# Patient Record
Sex: Female | Born: 1970 | Race: White | Hispanic: No | Marital: Married | Smoking: Never smoker
Health system: Southern US, Community
[De-identification: ages and names within clinical notes are randomized; demographics above are authoritative.]

## PROBLEM LIST (undated history)

## (undated) DIAGNOSIS — B009 Herpesviral infection, unspecified: Secondary | ICD-10-CM

## (undated) DIAGNOSIS — M1990 Unspecified osteoarthritis, unspecified site: Secondary | ICD-10-CM

## (undated) DIAGNOSIS — M069 Rheumatoid arthritis, unspecified: Secondary | ICD-10-CM

## (undated) HISTORY — DX: Rheumatoid arthritis, unspecified: M06.9

## (undated) HISTORY — DX: Herpesviral infection, unspecified: B00.9

---

## 2010-04-30 HISTORY — PX: ABDOMINOPLASTY: SUR9

## 2015-01-27 ENCOUNTER — Ambulatory Visit: Payer: Self-pay | Admitting: Family Medicine

## 2017-03-04 ENCOUNTER — Encounter: Payer: Self-pay | Admitting: Radiology

## 2017-10-29 ENCOUNTER — Encounter: Payer: Self-pay | Admitting: Radiology

## 2018-05-05 ENCOUNTER — Ambulatory Visit: Payer: Self-pay | Admitting: Family Medicine

## 2018-05-08 ENCOUNTER — Ambulatory Visit: Payer: Self-pay | Admitting: Family Medicine

## 2018-05-26 ENCOUNTER — Ambulatory Visit: Payer: Self-pay | Admitting: Family Medicine

## 2019-11-09 ENCOUNTER — Encounter: Payer: Self-pay | Admitting: Radiology

## 2020-08-08 ENCOUNTER — Other Ambulatory Visit: Payer: Self-pay | Admitting: Obstetrics and Gynecology

## 2020-08-08 DIAGNOSIS — R928 Other abnormal and inconclusive findings on diagnostic imaging of breast: Secondary | ICD-10-CM

## 2020-08-10 ENCOUNTER — Other Ambulatory Visit: Payer: Self-pay

## 2020-08-10 ENCOUNTER — Ambulatory Visit
Admission: RE | Admit: 2020-08-10 | Discharge: 2020-08-10 | Disposition: A | Discharge status: Home / Self Care | Payer: No Typology Code available for payment source | Source: Ambulatory Visit | Attending: Obstetrics and Gynecology | Admitting: Obstetrics and Gynecology

## 2020-08-10 ENCOUNTER — Ambulatory Visit: Payer: Self-pay

## 2020-08-10 DIAGNOSIS — R928 Other abnormal and inconclusive findings on diagnostic imaging of breast: Secondary | ICD-10-CM

## 2021-01-24 ENCOUNTER — Other Ambulatory Visit: Payer: Self-pay

## 2021-01-24 ENCOUNTER — Emergency Department (HOSPITAL_BASED_OUTPATIENT_CLINIC_OR_DEPARTMENT_OTHER)
Admission: EM | Admit: 2021-01-24 | Discharge: 2021-01-24 | Disposition: A | Discharge status: Home / Self Care | Payer: 59 | Attending: Emergency Medicine | Admitting: Emergency Medicine

## 2021-01-24 ENCOUNTER — Encounter (HOSPITAL_BASED_OUTPATIENT_CLINIC_OR_DEPARTMENT_OTHER): Payer: Self-pay | Admitting: *Deleted

## 2021-01-24 ENCOUNTER — Emergency Department (HOSPITAL_BASED_OUTPATIENT_CLINIC_OR_DEPARTMENT_OTHER): Payer: 59 | Admitting: Radiology

## 2021-01-24 DIAGNOSIS — J90 Pleural effusion, not elsewhere classified: Secondary | ICD-10-CM

## 2021-01-24 DIAGNOSIS — Z20822 Contact with and (suspected) exposure to covid-19: Secondary | ICD-10-CM | POA: Diagnosis not present

## 2021-01-24 DIAGNOSIS — W501XXA Accidental kick by another person, initial encounter: Secondary | ICD-10-CM | POA: Insufficient documentation

## 2021-01-24 DIAGNOSIS — S299XXA Unspecified injury of thorax, initial encounter: Secondary | ICD-10-CM | POA: Diagnosis present

## 2021-01-24 DIAGNOSIS — S20211A Contusion of right front wall of thorax, initial encounter: Secondary | ICD-10-CM | POA: Diagnosis not present

## 2021-01-24 HISTORY — DX: Unspecified osteoarthritis, unspecified site: M19.90

## 2021-01-24 LAB — RESP PANEL BY RT-PCR (FLU A&B, COVID) ARPGX2
Influenza A by PCR: NEGATIVE
Influenza B by PCR: NEGATIVE
SARS Coronavirus 2 by RT PCR: NEGATIVE
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
## 2021-01-24 NOTE — Discharge Instructions (Addendum)
It was our pleasure to provide your ER care today - we hope that you feel better.  From the recent contusion to chest wall, it is likely you have a bruised rib or a non-displaced rib fracture - a small right pleural effusion (I.e. small amount of fluid/possibly blood), is noted on your xray.   Take acetaminophen and/or ibuprofen as need for pain. Stay active, take full and deep breaths, light exercise is ok/good.   Follow up with primary care doctor in 1-2 weeks if symptoms fail to improve/resolve.  Return to ER if  worse, new symptoms, fevers, worsening cough, increased trouble breathing, or other concern.

## 2021-01-24 NOTE — ED Notes (Signed)
Patient verbalizes understanding of discharge instructions. Opportunity for questioning and answers were provided. Patient discharged from ED.  °

## 2021-01-26 ENCOUNTER — Ambulatory Visit: Payer: Self-pay
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
## 2021-02-20 ENCOUNTER — Other Ambulatory Visit: Payer: Self-pay

## 2021-02-20 ENCOUNTER — Encounter: Payer: Self-pay | Admitting: Physician Assistant

## 2021-02-20 ENCOUNTER — Ambulatory Visit (INDEPENDENT_AMBULATORY_CARE_PROVIDER_SITE_OTHER): Payer: 59 | Admitting: Physician Assistant

## 2021-02-20 VITALS — BP 101/68 | HR 69 | Temp 98.0°F | Ht 64.0 in | Wt 154.2 lb

## 2021-02-20 DIAGNOSIS — Z1211 Encounter for screening for malignant neoplasm of colon: Secondary | ICD-10-CM

## 2021-02-20 DIAGNOSIS — Z131 Encounter for screening for diabetes mellitus: Secondary | ICD-10-CM | POA: Diagnosis not present

## 2021-02-20 DIAGNOSIS — M129 Arthropathy, unspecified: Secondary | ICD-10-CM | POA: Diagnosis not present

## 2021-02-20 DIAGNOSIS — M1712 Unilateral primary osteoarthritis, left knee: Secondary | ICD-10-CM

## 2021-02-20 LAB — CBC WITH DIFFERENTIAL/PLATELET
Basophils Absolute: 0 10*3/uL (ref 0.0–0.1)
Basophils Relative: 0.4 % (ref 0.0–3.0)
Eosinophils Absolute: 0.3 10*3/uL (ref 0.0–0.7)
Eosinophils Relative: 3.3 % (ref 0.0–5.0)
HCT: 41.2 % (ref 36.0–46.0)
Hemoglobin: 13.4 g/dL (ref 12.0–15.0)
Lymphocytes Relative: 17.7 % (ref 12.0–46.0)
Lymphs Abs: 1.8 10*3/uL (ref 0.7–4.0)
MCHC: 32.5 g/dL (ref 30.0–36.0)
MCV: 89.2 fl (ref 78.0–100.0)
Monocytes Absolute: 0.8 10*3/uL (ref 0.1–1.0)
Monocytes Relative: 7.8 % (ref 3.0–12.0)
Neutro Abs: 7.4 10*3/uL (ref 1.4–7.7)
Neutrophils Relative %: 70.8 % (ref 43.0–77.0)
Platelets: 323 10*3/uL (ref 150.0–400.0)
RBC: 4.62 Mil/uL (ref 3.87–5.11)
RDW: 13.4 % (ref 11.5–15.5)
WBC: 10.4 10*3/uL (ref 4.0–10.5)

## 2021-02-20 LAB — COMPREHENSIVE METABOLIC PANEL
ALT: 13 U/L (ref 0–35)
AST: 17 U/L (ref 0–37)
Albumin: 4.3 g/dL (ref 3.5–5.2)
Alkaline Phosphatase: 65 U/L (ref 39–117)
BUN: 8 mg/dL (ref 6–23)
CO2: 26 mEq/L (ref 19–32)
Calcium: 9.2 mg/dL (ref 8.4–10.5)
Chloride: 102 mEq/L (ref 96–112)
Creatinine, Ser: 0.71 mg/dL (ref 0.40–1.20)
GFR: 100.24 mL/min (ref >60.00)
Glucose, Bld: 90 mg/dL (ref 70–99)
Potassium: 3.9 mEq/L (ref 3.5–5.1)
Sodium: 138 mEq/L (ref 135–145)
Total Bilirubin: 0.5 mg/dL (ref 0.2–1.2)
Total Protein: 7.1 g/dL (ref 6.0–8.3)

## 2021-02-20 LAB — URIC ACID: Uric Acid, Serum: 4.1 mg/dL (ref 2.4–7.0)

## 2021-02-20 LAB — SEDIMENTATION RATE: Sed Rate: 22 mm/hr — ABNORMAL HIGH (ref 0–20)

## 2021-02-20 NOTE — Progress Notes (Signed)
Subjective:    Patient ID: Sandra Cain, female    DOB: [birth year], 50 y.o.   MRN: [id number]  Chief Complaint  Patient presents with   Establish Care    HPI Patient is in today for new patient establishment. States a few weeks ago woke up with stiff, swollen joints diffusely. No fever, fatigue, headache, or other symptoms. She went to [facility], who drew a Lyme titer panel and all was negative except IgG P41 Ab was present. States that she was treated with doxycycline. Joint pain improved within three days of doxycycline. She has another two days left of treatment. She remembers having a tick bite in April this year, but says it was flat, not-engorged and probably on her skin less than 20 minutes. She never developed a rash after that. Mom has very bad osteoarthritis. Sister has hx of knee replacement.  Pt sees GYN for regular female exams. She has not had a screening colonoscopy done yet.   Past Medical History:  Diagnosis Date   Arthritis     Past Surgical History:  Procedure Laterality Date   CESAREAN SECTION     x 3    Family History  Problem Relation Age of Onset   Osteoarthritis Mother    Lung cancer Father    Osteoarthritis Sister    Alcoholism Brother    Drug abuse Brother     Social History   Tobacco Use   Smoking status: Never   Smokeless tobacco: Never  Vaping Use   Vaping Use: Never used  Substance Use Topics   Alcohol use: Never   Drug use: Never     Allergies  Allergen Reactions   Codeine Nausea And Vomiting   Nsaids Nausea And Vomiting   Flax Seeds [Flaxseed (Linseed)] Palpitations    Review of Systems REFER TO HPI FOR PERTINENT POSITIVES AND NEGATIVES      Objective:     BP 101/68   Pulse 69   Temp 98 F (36.7 C)   Ht 5' 4"  (1.626 m)   Wt 154 lb 3.2 oz (69.9 kg)   LMP 02/06/2021   SpO2 98%   BMI 26.47 kg/m   Wt Readings from Last 3 Encounters:  02/20/21 154 lb 3.2 oz (69.9 kg)  01/24/21 14 lb (6.35 kg)    BP  Readings from Last 3 Encounters:  02/20/21 101/68  01/24/21 111/70     Physical Exam Vitals and nursing note reviewed.  Constitutional:      Appearance: Normal appearance. She is normal weight. She is not toxic-appearing.  HENT:     Head: Normocephalic and atraumatic.     Right Ear: External ear normal.     Left Ear: External ear normal.     Nose: Nose normal.     Mouth/Throat:     Mouth: Mucous membranes are moist.  Eyes:     Extraocular Movements: Extraocular movements intact.     Conjunctiva/sclera: Conjunctivae normal.     Pupils: Pupils are equal, round, and reactive to light.  Cardiovascular:     Rate and Rhythm: Normal rate and regular rhythm.     Pulses: Normal pulses.     Heart sounds: Normal heart sounds.  Pulmonary:     Effort: Pulmonary effort is normal.     Breath sounds: Normal breath sounds.  Abdominal:     General: Abdomen is flat. Bowel sounds are normal.     Palpations: Abdomen is soft.  Musculoskeletal:  General: Normal range of motion.     Cervical back: Normal range of motion and neck supple.     Comments: No bogginess of joints. Good ROM in extremities. N/V intact.   Skin:    General: Skin is warm and dry.  Neurological:     General: No focal deficit present.     Mental Status: She is alert and oriented to person, place, and time.  Psychiatric:        Mood and Affect: Mood normal.        Behavior: Behavior normal.        Thought Content: Thought content normal.        Judgment: Judgment normal.       Assessment & Plan:   Problem List Items Addressed This Visit   None Visit Diagnoses     Arthritis, multiple joint involvement    -  Primary   Relevant Orders   CBC with Differential/Platelet   Comprehensive metabolic panel   Rheumatoid factor   Sedimentation rate   Uric acid   ANA   Primary osteoarthritis of left knee       Relevant Orders   CBC with Differential/Platelet   Comprehensive metabolic panel   Rheumatoid factor    Sedimentation rate   Uric acid   ANA   Screening for colon cancer       Relevant Orders   Ambulatory referral to Gastroenterology   Diabetes mellitus screening       Relevant Orders   Comprehensive metabolic panel       -Check labs today, treat pending results -Glad she is feeling better with the doxycycline. She has two days left to go. -She may also try anti-inflammatories such as Aleve or Advil prn pain and swelling. -Rheum referral if any positive markers for possible RA. -Referral for screening colonoscopy.   This note was prepared with assistance of Systems analyst. Occasional wrong-word or sound-a-like substitutions may have occurred due to the inherent limitations of voice recognition software.  Time Spent: 40 minutes of total time was spent on the date of the encounter performing the following actions: chart review prior to seeing the patient, obtaining history, performing a medically necessary exam, counseling on the treatment plan, placing orders, and documenting in our EHR.    Jeanet Lupe M Ina Scrivens, PA-C

## 2021-02-20 NOTE — Patient Instructions (Addendum)
Good to meet you today! Please go to the lab for blood work and I will send results through [facility]. Continue to stay active, drink plenty of water.

## 2021-02-21 ENCOUNTER — Telehealth: Payer: Self-pay

## 2021-02-21 LAB — RHEUMATOID FACTOR: Rheumatoid fact SerPl-aCnc: <14 IU/mL (ref <14)

## 2021-02-21 NOTE — Telephone Encounter (Signed)
Pt called regarding lab results. She would like a call back. Please Advise.

## 2021-02-21 NOTE — Telephone Encounter (Signed)
Patient has called back in regard to lab results.  I have advised patient in regard to lab notification process.  Patient is concerned about sedimentation rate.  Would like this to be addressed when followed up with.

## 2021-02-21 NOTE — Telephone Encounter (Signed)
Notified patient we will call once pcp review labs

## 2021-02-22 ENCOUNTER — Encounter: Payer: Self-pay | Admitting: Physician Assistant

## 2021-02-22 LAB — ANTI-NUCLEAR AB-TITER (ANA TITER): ANA Titer 1: 1:40 {titer} — ABNORMAL HIGH

## 2021-02-22 LAB — ANA: Anti Nuclear Antibody (ANA): POSITIVE — AB

## 2021-02-22 NOTE — Telephone Encounter (Signed)
See Result notes

## 2021-02-23 ENCOUNTER — Other Ambulatory Visit: Payer: Self-pay | Admitting: Physician Assistant

## 2021-02-23 ENCOUNTER — Telehealth: Payer: Self-pay

## 2021-02-23 MED ORDER — METHYLPREDNISOLONE 4 MG PO TBPK: ORAL_TABLET | ORAL | 0 refills | Status: DC

## 2021-02-23 NOTE — Telephone Encounter (Signed)
Spoke with patient she stated she wants to talk to [facility] explained that [facility] see's patients from 7:30 until. She stated no one has contacted her since Tuesday. I informed her we have responded to her via mychart and by phone. I offered her a appointment to go over lab concerns and other issues,patient declined. Patient stated she will be going to another provider.

## 2021-02-23 NOTE — Telephone Encounter (Signed)
Noted  

## 2021-02-23 NOTE — Telephone Encounter (Signed)
Pt called requesting to speak to Alyssa directly and not to a nurse. Pt wants to go over a few things from her appt on 10/24. Please Advise.

## 2021-02-23 NOTE — Progress Notes (Signed)
Medrol dose pak sent for patient.

## 2021-03-15 ENCOUNTER — Ambulatory Visit: Payer: 59 | Admitting: Family

## 2021-05-31 ENCOUNTER — Other Ambulatory Visit: Payer: Self-pay | Admitting: Radiology

## 2021-05-31 ENCOUNTER — Encounter: Payer: Self-pay | Admitting: Radiology

## 2021-06-01 ENCOUNTER — Other Ambulatory Visit: Payer: Self-pay

## 2021-06-01 ENCOUNTER — Encounter: Payer: Self-pay | Admitting: Radiology

## 2021-06-01 ENCOUNTER — Ambulatory Visit: Payer: 59 | Admitting: Radiology

## 2021-06-01 VITALS — BP 116/60 | Wt 161.0 lb

## 2021-06-01 DIAGNOSIS — N898 Other specified noninflammatory disorders of vagina: Secondary | ICD-10-CM | POA: Diagnosis not present

## 2021-06-01 DIAGNOSIS — N921 Excessive and frequent menstruation with irregular cycle: Secondary | ICD-10-CM

## 2021-06-01 LAB — WET PREP FOR TRICH, YEAST, CLUE

## 2021-06-01 NOTE — Progress Notes (Signed)
SUBJECTIVE:  51 y.o. female complains of foul and grey vaginal discharge for 3 month(s) off and on. Treated for BV in November, also tried boric acid suppositories. Feels it has been more prevalent since she has had a prolonged menses due to prednisone use. (Pt started in late November and next 2 periods were longer and heavier) Denies significant pelvic pain or fever. No UTI symptoms. Denies history of known exposure to STD.   Patient's last menstrual period was 04/13/2021.  OBJECTIVE:  She appears well, afebrile. Abdomen: benign, soft, nontender, no masses. Pelvic Exam: normal external genitalia, vulva, vagina, cervix, uterus and adnexa, VULVA: normal appearing vulva with no masses, tenderness or lesions, VAGINA: normal appearing vagina with normal color and discharge, no lesions, WET MOUNT done - results: negative for pathogens, normal epithelial cells, CERVIX: normal appearing cervix without discharge or lesions, UTERUS: uterus is normal size, shape, consistency and nontender, ADNEXA: normal adnexa in size, nontender and no masses. Urine dipstick: not done.  ASSESSMENT:  no pathogens identified causing these symptoms Menometrorrhagia  PLAN:   Declines hormonal management of bleeding. Will continue to monitor. Since she just stopped the prednisone we will give it a few weeks to regulate. IF not will return to office Treatment: Boricaps prn odor and itch. Abstain from coitus during course of treatment. ROV prn if symptoms persist or worsen.

## 2021-08-11 ENCOUNTER — Other Ambulatory Visit: Payer: Self-pay | Admitting: Radiology

## 2021-08-11 DIAGNOSIS — Z1231 Encounter for screening mammogram for malignant neoplasm of breast: Secondary | ICD-10-CM

## 2021-08-15 ENCOUNTER — Other Ambulatory Visit (HOSPITAL_COMMUNITY)
Admission: RE | Admit: 2021-08-15 | Discharge: 2021-08-15 | Disposition: A | Discharge status: Home / Self Care | Payer: 59 | Source: Ambulatory Visit | Attending: Radiology | Admitting: Radiology

## 2021-08-15 ENCOUNTER — Encounter: Payer: Self-pay | Admitting: Radiology

## 2021-08-15 ENCOUNTER — Ambulatory Visit (INDEPENDENT_AMBULATORY_CARE_PROVIDER_SITE_OTHER): Payer: 59 | Admitting: Radiology

## 2021-08-15 VITALS — BP 108/64 | Ht 64.75 in | Wt 153.0 lb

## 2021-08-15 DIAGNOSIS — R87618 Other abnormal cytological findings on specimens from cervix uteri: Secondary | ICD-10-CM | POA: Insufficient documentation

## 2021-08-15 DIAGNOSIS — Z01419 Encounter for gynecological examination (general) (routine) without abnormal findings: Secondary | ICD-10-CM | POA: Diagnosis not present

## 2021-08-15 DIAGNOSIS — L309 Dermatitis, unspecified: Secondary | ICD-10-CM

## 2021-08-15 DIAGNOSIS — Z1211 Encounter for screening for malignant neoplasm of colon: Secondary | ICD-10-CM | POA: Diagnosis not present

## 2021-08-15 MED ORDER — TRIAMCINOLONE ACETONIDE 0.5 % EX OINT: 1.0000 "application " | TOPICAL_OINTMENT | Freq: Two times a day (BID) | CUTANEOUS | 0 refills | Status: DC

## 2021-08-15 MED ORDER — FLUCONAZOLE 150 MG PO TABS: 150.0000 mg | ORAL_TABLET | ORAL | 2 refills | Status: DC

## 2021-08-15 NOTE — Progress Notes (Signed)
? ?Sandra Cain 15-Jul-1970 [id number] ? ? ?History:  51 y.o. G3P3 presents for annual exam. C/o itchy rash occasionally upper chest. Otherwise, no gyn concerns. ? ?Gynecologic History ?Patient's last menstrual period was 07/29/2021 (exact date). ?Period Cycle (Days):  (irregular periods when she has steriods) ?Period Pattern: (!) Irregular ?Menstrual Flow: Light, Heavy (varies light to heavy) ?Dysmenorrhea: (!) Mild ?Contraception/Family planning: coitus interruptus ?Sexually active: yes ?Last Pap: 2022. Results were: normal, HPV + ?Last mammogram: 4/22. Results were: normal ? ?Obstetric History ?OB History  ?Gravida Para Term Preterm AB Living  ?3 3       3   ?SAB IAB Ectopic Multiple Live Births  ?           ?  ?# Outcome Date GA Lbr Len/2nd Weight Sex Delivery Anes PTL Lv  ?3 Para           ?2 Para           ?1 Para           ? ? ? ?The following portions of the patient's history were reviewed and updated as appropriate: allergies, current medications, past family history, past medical history, past social history, past surgical history, and problem list. ? ?Review of Systems ?Pertinent items noted in HPI and remainder of comprehensive ROS otherwise negative.  ? ?Past medical history, past surgical history, family history and social history were all reviewed and documented in the EPIC chart. ? ? ?Exam: ? ?Vitals:  ? 08/15/21 0755  ?BP: 108/64  ?Weight: 153 lb (69.4 kg)  ?Height: 5' 4.75" (1.645 m)  ? ?Body mass index is 25.66 kg/m?. ? ?General appearance:  Normal ?Thyroid:  Symmetrical, normal in size, without palpable masses or nodularity. ?Respiratory ? Auscultation:  Clear without wheezing or rhonchi ?Cardiovascular ? Auscultation:  Regular rate, without rubs, murmurs or gallops ? Edema/varicosities:  Not grossly evident ?Abdominal ? Soft,nontender, without masses, guarding or rebound. ? Liver/spleen:  No organomegaly noted ? Hernia:  None appreciated ? Skin ? Inspection:  Grossly normal, dermatitis over  chest ?Breasts: Examined lying and sitting.  ? Right: Without masses, retractions, nipple discharge or axillary adenopathy. ? ? Left: Without masses, retractions, nipple discharge or axillary adenopathy. ?Genitourinary  ? Inguinal/mons:  Normal without inguinal adenopathy ? External genitalia:  Normal appearing vulva with no masses, tenderness, or lesions ? BUS/Urethra/Skene's glands:  Normal without masses or exudate ? Vagina:  Normal appearing with normal color and discharge, no lesions ? Cervix:  Normal appearing without discharge or lesions ? Uterus:  Normal in size, shape and contour.  Mobile, nontender ? Adnexa/parametria:   ?  Rt: Normal in size, without masses or tenderness. ?  Lt: Normal in size, without masses or tenderness. ? Anus and perineum: Normal ?  ?Patient informed chaperone available to be present for breast and pelvic exam. Patient has requested no chaperone to be present. Patient has been advised what will be completed during breast and pelvic exam.  ? ?Assessment/Plan:   ?1. Well woman exam with routine gynecological exam ?Mammo 08/10/20 ?Mammo scheduled  ?Requests refill on dilfucan to have on hand, rx sent ?2. Pap smear abnormality of cervix/human papillomavirus (HPV) positive ? ?- Cytology - PAP( [place]) ? ?3. Screening for colon cancer ? ?- Cologuard ? ?4. Dermatitis ? ?- triamcinolone ointment (KENALOG) 0.5 %; Apply 1 application. topically 2 (two) times daily.  Dispense: 30 g; Refill: 0  ? ? ? ?Discussed SBE, colonoscopy and DEXA screening as directed/appropriate. Recommend 172mns of exercise weekly,  including weight bearing exercise. Encouraged the use of seatbelts and sunscreen. ?Return in 1 year for annual or as needed.  ? ?Rubbie Battiest B WHNP-BC 8:40 AM 08/15/2021  ?

## 2021-08-16 LAB — CYTOLOGY - PAP
Adequacy: ABSENT
Comment: NEGATIVE
Diagnosis: NEGATIVE
High risk HPV: NEGATIVE

## 2021-08-18 ENCOUNTER — Ambulatory Visit
Admission: RE | Admit: 2021-08-18 | Discharge: 2021-08-18 | Disposition: A | Discharge status: Home / Self Care | Payer: 59 | Source: Ambulatory Visit | Attending: Radiology | Admitting: Radiology

## 2021-08-18 DIAGNOSIS — Z1231 Encounter for screening mammogram for malignant neoplasm of breast: Secondary | ICD-10-CM

## 2021-09-06 LAB — COLOGUARD: COLOGUARD: NEGATIVE

## 2021-10-04 ENCOUNTER — Other Ambulatory Visit: Payer: Self-pay | Admitting: Radiology

## 2021-10-04 ENCOUNTER — Other Ambulatory Visit (HOSPITAL_BASED_OUTPATIENT_CLINIC_OR_DEPARTMENT_OTHER): Payer: Self-pay

## 2021-10-04 DIAGNOSIS — B9689 Other specified bacterial agents as the cause of diseases classified elsewhere: Secondary | ICD-10-CM

## 2021-10-04 MED ORDER — NUVESSA 1.3 % VA GEL
1.0000 | Freq: Once | VAGINAL | 1 refills | Status: AC
  Filled 2021-10-04: qty 5, 1d supply, fill #0

## 2021-10-04 MED ORDER — NUVESSA 1.3 % VA GEL: 1.0000 | Freq: Once | VAGINAL | 1 refills | Status: DC

## 2021-10-04 NOTE — Progress Notes (Signed)
Pt called concerned re: persistent BV. Tried Boric Acid suppositories x 1 week with no improvement. Would like an RX called iin. Nuvessa sent to pharmacy, if no improvement after treatment will need to come in for wet mount. Pt agrees with plan.

## 2021-10-05 ENCOUNTER — Other Ambulatory Visit (HOSPITAL_BASED_OUTPATIENT_CLINIC_OR_DEPARTMENT_OTHER): Payer: Self-pay

## 2021-10-31 ENCOUNTER — Telehealth: Payer: 59

## 2021-12-27 ENCOUNTER — Telehealth: Payer: Self-pay

## 2021-12-27 ENCOUNTER — Other Ambulatory Visit: Payer: Self-pay | Admitting: Radiology

## 2021-12-27 MED ORDER — FLUCONAZOLE 150 MG PO TABS: 150.0000 mg | ORAL_TABLET | ORAL | 2 refills | Status: DC

## 2021-12-27 NOTE — Telephone Encounter (Signed)
Left detailed message that Rx she requested has been sent to [facility].

## 2021-12-27 NOTE — Telephone Encounter (Signed)
[facility]. 

## 2021-12-27 NOTE — Telephone Encounter (Signed)
Patient called stating she has been on Cephalexin (?) antibiotic for a persistant stye on her eye due to her RA.  She said she is starting to have yeast infection sx and wondered if you would send in Diflucan to head off the symptoms.

## 2022-01-22 ENCOUNTER — Encounter: Payer: Self-pay | Admitting: *Deleted

## 2022-03-12 ENCOUNTER — Telehealth: Payer: Self-pay | Admitting: *Deleted

## 2022-03-12 NOTE — Telephone Encounter (Signed)
Patient called and left message in triage voicemail requesting a refill on amoxicillin 500 mg to takes after intercourse as preventive. Pharmacy is [facility] [address].

## 2022-03-13 ENCOUNTER — Other Ambulatory Visit: Payer: Self-pay | Admitting: Radiology

## 2022-03-13 DIAGNOSIS — N3000 Acute cystitis without hematuria: Secondary | ICD-10-CM

## 2022-03-13 MED ORDER — AMOXICILLIN 500 MG PO CAPS: 500.0000 mg | ORAL_CAPSULE | ORAL | 0 refills | Status: DC

## 2022-03-15 NOTE — Telephone Encounter (Signed)
Jami sent Rx on 03/13/22

## 2022-04-12 ENCOUNTER — Encounter: Payer: Self-pay | Admitting: *Deleted

## 2022-05-09 ENCOUNTER — Other Ambulatory Visit: Payer: Self-pay | Admitting: Radiology

## 2022-05-09 ENCOUNTER — Telehealth: Payer: Self-pay

## 2022-05-09 DIAGNOSIS — B001 Herpesviral vesicular dermatitis: Secondary | ICD-10-CM

## 2022-05-09 MED ORDER — VALACYCLOVIR HCL 1 G PO TABS
1000.0000 mg | ORAL_TABLET | Freq: Two times a day (BID) | ORAL | 1 refills | Status: AC
Start: 2022-05-09 — End: ?

## 2022-05-09 NOTE — Telephone Encounter (Signed)
Patient left message in voice mail.  Recent bout of Covid and as a result is getting a cold sore. She said she is out of the Acyclovir she usually takes for it and is requesting Rx.   I did not see it her med list where it has been prescribed before.

## 2022-05-09 NOTE — Telephone Encounter (Signed)
Chrzanowski, Annitta Needs, NP  [facility] (10:29 AM)   Contacted patient for dose clarification. Rx sent for Valacyclovir 1gram to take po BID x 3 days as needed for herpes labialis.

## 2022-06-01 ENCOUNTER — Ambulatory Visit: Payer: 59 | Admitting: Obstetrics and Gynecology

## 2022-06-01 ENCOUNTER — Encounter: Payer: Self-pay | Admitting: Obstetrics and Gynecology

## 2022-06-01 VITALS — BP 108/60 | Wt 154.0 lb

## 2022-06-01 DIAGNOSIS — N907 Vulvar cyst: Secondary | ICD-10-CM | POA: Diagnosis not present

## 2022-06-01 NOTE — Patient Instructions (Signed)
Epidermoid Cyst  An epidermoid cyst, also known as epidermal cyst, is a sac made of skin tissue. The sac contains a substance called keratin. Keratin is a protein that is normally secreted through the hair follicles. When keratin becomes trapped in the top layer of skin (epidermis), it can form an epidermoid cyst. Epidermoid cysts can be found anywhere on your body. These cysts are usually harmless (benign), and they may not cause symptoms unless they become inflamed or infected. What are the causes? This condition may be caused by: A blocked hair follicle. A hair that curls and re-enters the skin instead of growing straight out of the skin (ingrown hair). A blocked pore. Irritated skin. An injury to the skin. Certain conditions that are passed along from parent to child (inherited). Human papillomavirus (HPV). This happens rarely when cysts occur on the bottom of the feet. Long-term (chronic) sun damage to the skin. What increases the risk? The following factors may make you more likely to develop an epidermoid cyst: Having acne. Being female. Having an injury to the skin. Being past puberty. Having certain rare genetic disorders. What are the signs or symptoms? The only symptom of this condition may be a small, painless lump underneath the skin. When an epidermal cyst ruptures, it may become inflamed. True infection in cysts is rare. Symptoms may include: Redness. Inflammation. Tenderness. Warmth. Keratin draining from the cyst. Keratin is grayish-white, bad-smelling substance. Pus draining from the cyst. How is this diagnosed? This condition is diagnosed with a physical exam. In some cases, you may have a sample of tissue (biopsy) taken from your cyst to be examined under a microscope or tested for bacteria. You may be referred to a health care provider who specializes in skin care (dermatologist). How is this treated? If a cyst becomes inflamed, treatment may include: Opening and  draining the cyst, done by a health care provider. After draining, minor surgery to remove the rest of the cyst may be done. Taking antibiotic medicine. Having injections of medicines (steroids) that help to reduce inflammation. Having surgery to remove the cyst. Surgery may be done if the cyst: Becomes large. Bothers you. Has a chance of turning into cancer. Do not try to open a cyst yourself. Follow these instructions at home: Medicines If you were prescribed an antibiotic medicine, take it it as told by your health care provider. Do not stop using the antibiotic even if you start to feel better. Take over-the-counter and prescription medicines only as told by your health care provider. General instructions Keep the area around your cyst clean and dry. Wear loose, dry clothing. Avoid touching your cyst. Check your cyst every day for signs of infection. Check for: Redness, swelling, or pain. Fluid or blood. Warmth. Pus or a bad smell. Keep all follow-up visits. This is important. How is this prevented? Wear clean, dry, clothing. Avoid wearing tight clothing. Keep your skin clean and dry. Take showers or baths every day. Contact a health care provider if: Your cyst develops symptoms of infection. Your condition is not improving or is getting worse. You develop a cyst that looks different from other cysts you have had. You have a fever. Get help right away if: Redness spreads from the cyst into the surrounding area. Summary An epidermoid cyst is a sac made of skin tissue. These cysts are usually harmless (benign), and they may not cause symptoms unless they become inflamed. If a cyst becomes inflamed, treatment may include surgery to open and drain the   cyst, or to remove it. Treatment may also include medicines by mouth or through an injection. Take over-the-counter and prescription medicines only as told by your health care provider. If you were prescribed an antibiotic medicine,  take it as told by your health care provider. Do not stop using the antibiotic even if you start to feel better. Contact a health care provider if your condition is not improving or is getting worse. Keep all follow-up visits as told by your health care provider. This is important. This information is not intended to replace advice given to you by your health care provider. Make sure you discuss any questions you have with your health care provider. Document Revised: 07/22/2019 Document Reviewed: 07/22/2019 Elsevier Patient Education  2023 Elsevier Inc.  
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
## 2022-08-13 ENCOUNTER — Other Ambulatory Visit: Payer: Self-pay | Admitting: Radiology

## 2022-08-13 DIAGNOSIS — Z1231 Encounter for screening mammogram for malignant neoplasm of breast: Secondary | ICD-10-CM

## 2022-08-23 NOTE — Telephone Encounter (Signed)
Reviewed with Jami on 08/22/22.  Patient will need to contact provider managing grief for request.   MyChart message to patient.

## 2022-10-05 ENCOUNTER — Ambulatory Visit: Payer: 59

## 2022-10-09 ENCOUNTER — Ambulatory Visit: Payer: 59

## 2022-10-10 ENCOUNTER — Ambulatory Visit: Payer: 59

## 2022-10-24 ENCOUNTER — Ambulatory Visit
Admission: RE | Admit: 2022-10-24 | Discharge: 2022-10-24 | Disposition: A | Discharge status: Home / Self Care | Payer: 59 | Source: Ambulatory Visit | Attending: Radiology | Admitting: Radiology

## 2022-10-24 DIAGNOSIS — Z1231 Encounter for screening mammogram for malignant neoplasm of breast: Secondary | ICD-10-CM

## 2022-11-02 ENCOUNTER — Ambulatory Visit: Payer: 59 | Admitting: Radiology

## 2022-11-07 ENCOUNTER — Telehealth: Payer: Self-pay

## 2022-11-07 DIAGNOSIS — N760 Acute vaginitis: Secondary | ICD-10-CM

## 2022-11-07 MED ORDER — FLUCONAZOLE 150 MG PO TABS
ORAL_TABLET | ORAL | 0 refills | Status: DC
Start: 2022-11-07 — End: 2023-03-22

## 2022-11-07 NOTE — Telephone Encounter (Signed)
Ok to send fluconazole 150mg  po repeat in 3 days

## 2022-11-07 NOTE — Telephone Encounter (Signed)
Pt LVM in triage line stating that she has been on abx now for ~2 months from eye doc for styes in eye due to RA. States that her body has done well on them for a while now but feels like she may be developing sxs of a yeast infection and is inquiring if we could send in rx for diflucan. Please advise.  Last AEX 08/15/2021--scheduled for 12/06/2022. Last seen for OV on 06/01/2022 w/ BS.

## 2022-11-07 NOTE — Telephone Encounter (Signed)
Pt notified and voiced understanding and appreciation. Rx sent. Will route to provider for final review and close encounter.

## 2022-12-06 ENCOUNTER — Ambulatory Visit: Payer: 59 | Admitting: Radiology

## 2023-03-11 ENCOUNTER — Ambulatory Visit: Payer: 59 | Admitting: Radiology

## 2023-03-19 ENCOUNTER — Ambulatory Visit: Payer: 59 | Admitting: Radiology

## 2023-03-22 ENCOUNTER — Ambulatory Visit (INDEPENDENT_AMBULATORY_CARE_PROVIDER_SITE_OTHER): Payer: 59 | Admitting: Radiology

## 2023-03-22 ENCOUNTER — Encounter: Payer: Self-pay | Admitting: Radiology

## 2023-03-22 VITALS — BP 104/76 | Ht 64.25 in | Wt 140.0 lb

## 2023-03-22 DIAGNOSIS — Z01419 Encounter for gynecological examination (general) (routine) without abnormal findings: Secondary | ICD-10-CM

## 2023-03-22 DIAGNOSIS — L309 Dermatitis, unspecified: Secondary | ICD-10-CM | POA: Diagnosis not present

## 2023-03-22 DIAGNOSIS — N958 Other specified menopausal and perimenopausal disorders: Secondary | ICD-10-CM | POA: Diagnosis not present

## 2023-03-22 DIAGNOSIS — Z8262 Family history of osteoporosis: Secondary | ICD-10-CM | POA: Diagnosis not present

## 2023-03-22 DIAGNOSIS — N951 Menopausal and female climacteric states: Secondary | ICD-10-CM

## 2023-03-22 MED ORDER — IMVEXXY MAINTENANCE PACK 10 MCG VA INST: 1.0000 | VAGINAL_INSERT | VAGINAL | 11 refills | Status: AC

## 2023-03-22 MED ORDER — TRIAMCINOLONE ACETONIDE 0.5 % EX OINT: 1.0000 | TOPICAL_OINTMENT | Freq: Two times a day (BID) | CUTANEOUS | 0 refills | Status: DC

## 2023-03-22 NOTE — Progress Notes (Signed)
   Sandra Cain 1971-04-10 [id number]   History:  52 y.o. G3P3 presents for annual exam. C/o menopausal symptoms. Hot flashes, trouble sleeping, dyspareunia, joint pain. Needs a refill on triamcinolone for occasional dermatitis. Doing well on compounded semaglutide, RA symptoms have improved. Worried about bone health. Mother has severe osteoporosis early, she has used steroids for years due to her RA.   Gynecologic History Patient's last menstrual period was 07/30/2022 (approximate). Period Duration (Days): 10 Period Pattern: (!) Irregular Menstrual Flow: Heavy Menstrual Control: Thin pad, Maxi pad Dysmenorrhea: (!) Mild Dysmenorrhea Symptoms: Cramping Contraception/Family planning: post menopausal status Sexually active: yes Last Pap: 2023. Results were: normal Last mammogram: 10/24/22. Results were: normal  Obstetric History OB History  Gravida Para Term Preterm AB Living  3 3       3   SAB IAB Ectopic Multiple Live Births               # Outcome Date GA Lbr Len/2nd Weight Sex Type Anes PTL Lv  3 Para           2 Para           1 Para             The following portions of the patient's history were reviewed and updated as appropriate: allergies, current medications, past family history, past medical history, past social history, past surgical history, and problem list.  ROS  Past medical history, past surgical history, family history and social history were all reviewed and documented in the EPIC chart.  Exam:  Vitals:   03/22/23 1354  BP: 104/76  Weight: 140 lb (63.5 kg)  Height: 5' 4.25" (1.632 m)   Body mass index is 23.84 kg/m.  Physical Exam   Raynelle Fanning, CMA present for exam  Assessment/Plan:   1. Well woman exam with routine gynecological exam Pap 2026 Labs with PCP Mammo yearly Cologuard 2026  2. Genitourinary syndrome of menopause - Estradiol (IMVEXXY MAINTENANCE PACK) 10 MCG INST; Place 1 tablet vaginally 2 (two) times a week.  Dispense: 8 each;  Refill: 11  3. Dermatitis - triamcinolone ointment (KENALOG) 0.5 %; Apply 1 Application topically 2 (two) times daily.  Dispense: 30 g; Refill: 0  4. Family history of osteoporosis in mother - DG Bone Density; Future  5. Menopausal symptoms Counseled on risks and benefits of HRT, will consider.    Discussed SBE, colonoscopy and DEXA screening as directed/appropriate. Recommend of exercise weekly, including weight bearing exercise. Encouraged the use of seatbelts and sunscreen.  No follow-ups on file.  Arlie Solomons B WHNP-BC 2:44 PM 03/22/2023

## 2023-03-22 NOTE — Patient Instructions (Signed)
Preventive Care 51-51 Years Old, Female Preventive care refers to lifestyle choices and visits with your health care provider that can promote health and wellness. Preventive care visits are also called wellness exams. What can I expect for my preventive care visit? Counseling Your health care provider may ask you questions about your: Medical history, including: Past medical problems. Family medical history. Pregnancy history. Current health, including: Menstrual cycle. Method of birth control. Emotional well-being. Home life and relationship well-being. Sexual activity and sexual health. Lifestyle, including: Alcohol, nicotine or tobacco, and drug use. Access to firearms. Diet, exercise, and sleep habits. Work and work environment. Sunscreen use. Safety issues such as seatbelt and bike helmet use. Physical exam Your health care provider will check your: Height and weight. These may be used to calculate your BMI (body mass index). BMI is a measurement that tells if you are at a healthy weight. Waist circumference. This measures the distance around your waistline. This measurement also tells if you are at a healthy weight and may help predict your risk of certain diseases, such as type 2 diabetes and high blood pressure. Heart rate and blood pressure. Body temperature. Skin for abnormal spots. What immunizations do I need?  Vaccines are usually given at various ages, according to a schedule. Your health care provider will recommend vaccines for you based on your age, medical history, and lifestyle or other factors, such as travel or where you work. What tests do I need? Screening Your health care provider may recommend screening tests for certain conditions. This may include: Lipid and cholesterol levels. Diabetes screening. This is done by checking your blood sugar (glucose) after you have not eaten for a while (fasting). Pelvic exam and Pap test. Hepatitis B test. Hepatitis C  test. HIV (human immunodeficiency virus) test. STI (sexually transmitted infection) testing, if you are at risk. Lung cancer screening. Colorectal cancer screening. Mammogram. Talk with your health care provider about when you should start having regular mammograms. This may depend on whether you have a family history of breast cancer. BRCA-related cancer screening. This may be done if you have a family history of breast, ovarian, tubal, or peritoneal cancers. Bone density scan. This is done to screen for osteoporosis. Talk with your health care provider about your test results, treatment options, and if necessary, the need for more tests. Follow these instructions at home: Eating and drinking  Eat a diet that includes fresh fruits and vegetables, whole grains, lean protein, and low-fat dairy products. Take vitamin and mineral supplements as recommended by your health care provider. Do not drink alcohol if: Your health care provider tells you not to drink. You are pregnant, may be pregnant, or are planning to become pregnant. If you drink alcohol: Limit how much you have to 0-1 drink a day. Know how much alcohol is in your drink. In the [place] one drink equals one 12 oz bottle of beer (355 mL), one 5 oz glass of wine (148 mL), or one 1 oz glass of hard liquor (44 mL). Lifestyle Brush your teeth every morning and night with fluoride toothpaste. Floss one time each day. Exercise for at least 30 minutes 5 or more days each week. Do not use any products that contain nicotine or tobacco. These products include cigarettes, chewing tobacco, and vaping devices, such as e-cigarettes. If you need help quitting, ask your health care provider. Do not use drugs. If you are sexually active, practice safe sex. Use a condom or other form of protection to   prevent STIs. If you do not wish to become pregnant, use a form of birth control. If you plan to become pregnant, see your health care provider for a  prepregnancy visit. Take aspirin only as told by your health care provider. Make sure that you understand how much to take and what form to take. Work with your health care provider to find out whether it is safe and beneficial for you to take aspirin daily. Find healthy ways to manage stress, such as: Meditation, yoga, or listening to music. Journaling. Talking to a trusted person. Spending time with friends and family. Minimize exposure to UV radiation to reduce your risk of skin cancer. Safety Always wear your seat belt while driving or riding in a vehicle. Do not drive: If you have been drinking alcohol. Do not ride with someone who has been drinking. When you are tired or distracted. While texting. If you have been using any mind-altering substances or drugs. Wear a helmet and other protective equipment during sports activities. If you have firearms in your house, make sure you follow all gun safety procedures. Seek help if you have been physically or sexually abused. What's next? Visit your health care provider once a year for an annual wellness visit. Ask your health care provider how often you should have your eyes and teeth checked. Stay up to date on all vaccines. This information is not intended to replace advice given to you by your health care provider. Make sure you discuss any questions you have with your health care provider. Document Revised: 10/12/2020 Document Reviewed: 10/12/2020 Elsevier Patient Education  2024 Elsevier Inc.  

## 2023-03-26 ENCOUNTER — Other Ambulatory Visit: Payer: Self-pay | Admitting: Radiology

## 2023-03-26 DIAGNOSIS — N951 Menopausal and female climacteric states: Secondary | ICD-10-CM

## 2023-03-26 MED ORDER — ESTRADIOL 0.025 MG/24HR TD PTTW: 1.0000 | MEDICATED_PATCH | TRANSDERMAL | 12 refills | Status: AC

## 2023-03-26 MED ORDER — PROGESTERONE MICRONIZED 100 MG PO CAPS: 100.0000 mg | ORAL_CAPSULE | Freq: Every day | ORAL | 0 refills | Status: DC

## 2023-03-26 NOTE — Progress Notes (Signed)
Patient contacted office stating she would like to start the HRT discussed at her last visit. RX sent to [facility] on file.  Menopause syndrome - estradiol (VIVELLE-DOT) 0.025 MG/24HR; Place 1 patch onto the skin 2 (two) times a week.  Dispense: 24 patch; Refill: 12 - progesterone (PROMETRIUM) 100 MG capsule; Take 1 capsule (100 mg total) by mouth daily.  Dispense: 90 capsule; Refill: 0   Follow up 3 months for med check  Arlie Solomons, [facility]

## 2023-04-08 ENCOUNTER — Inpatient Hospital Stay (HOSPITAL_BASED_OUTPATIENT_CLINIC_OR_DEPARTMENT_OTHER): Admission: RE | Admit: 2023-04-08 | Payer: 59 | Source: Ambulatory Visit

## 2023-04-12 ENCOUNTER — Telehealth (HOSPITAL_BASED_OUTPATIENT_CLINIC_OR_DEPARTMENT_OTHER): Payer: Self-pay | Admitting: Radiology

## 2023-04-17 ENCOUNTER — Ambulatory Visit (HOSPITAL_BASED_OUTPATIENT_CLINIC_OR_DEPARTMENT_OTHER)
Admission: RE | Admit: 2023-04-17 | Discharge: 2023-04-17 | Disposition: A | Discharge status: Home / Self Care | Payer: 59 | Source: Ambulatory Visit | Attending: Radiology | Admitting: Radiology

## 2023-04-17 DIAGNOSIS — Z13820 Encounter for screening for osteoporosis: Secondary | ICD-10-CM | POA: Insufficient documentation

## 2023-04-17 DIAGNOSIS — Z8262 Family history of osteoporosis: Secondary | ICD-10-CM | POA: Diagnosis present

## 2023-04-19 ENCOUNTER — Ambulatory Visit: Payer: 59 | Admitting: Radiology

## 2023-05-22 ENCOUNTER — Ambulatory Visit (HOSPITAL_BASED_OUTPATIENT_CLINIC_OR_DEPARTMENT_OTHER): Payer: 59 | Admitting: Orthopaedic Surgery

## 2023-05-28 IMAGING — MG MM DIGITAL SCREENING BILAT W/ TOMO AND CAD
8 series · 9 of 24 positions shown · non-contrast
Comparison: Previous exam(s).

CLINICAL DATA: Screening.

EXAM:
DIGITAL SCREENING BILATERAL MAMMOGRAM WITH TOMOSYNTHESIS AND CAD
TECHNIQUE: Bilateral screening digital craniocaudal and mediolateral oblique
mammograms were obtained. Bilateral screening digital breast
tomosynthesis was performed. The images were evaluated with
computer-aided detection.

[L CC synth-2D]
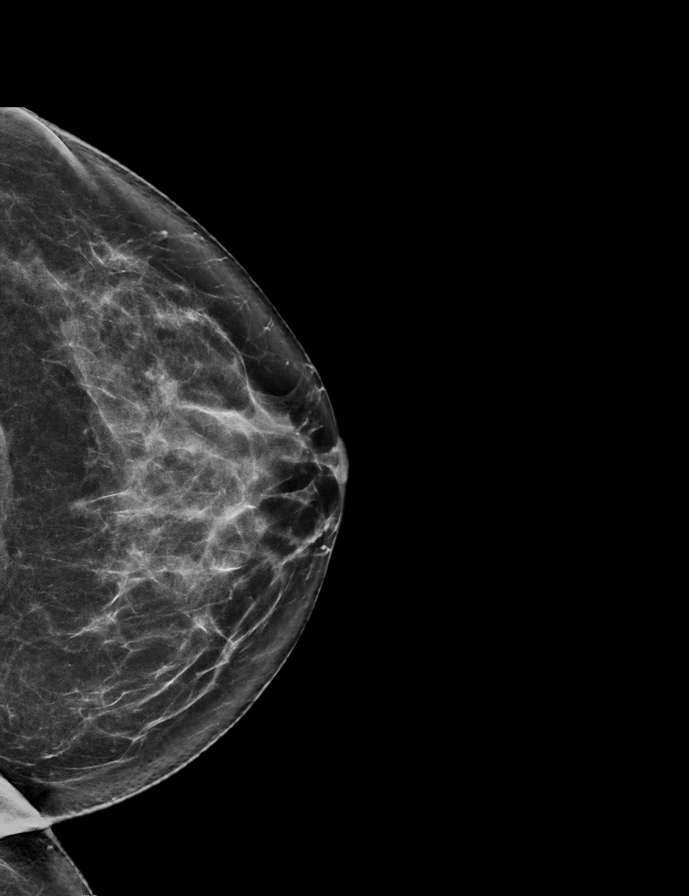

[R MLO synth-2D]
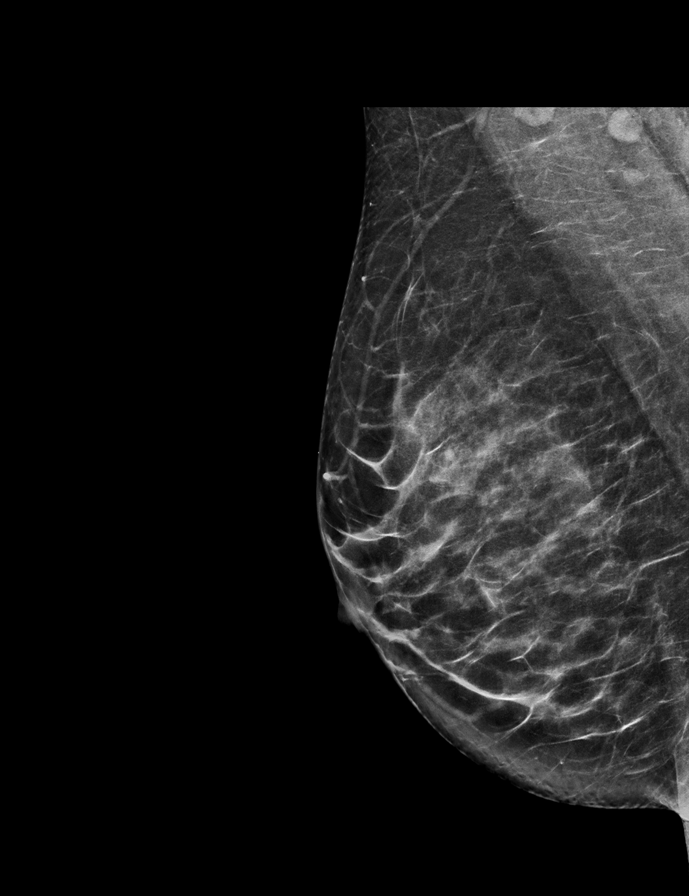

[L MLO synth-2D]
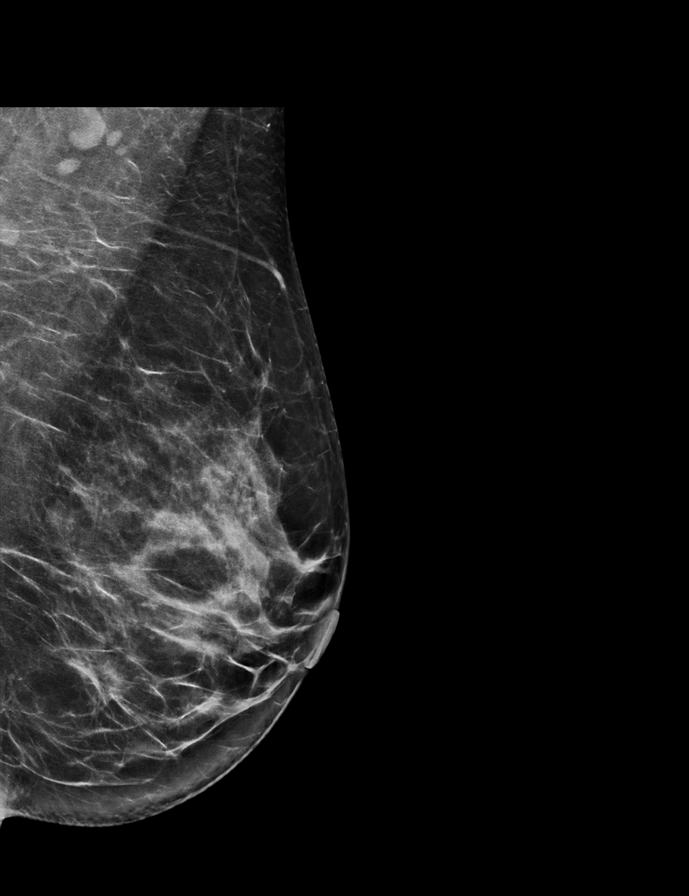

[R CC synth-2D]
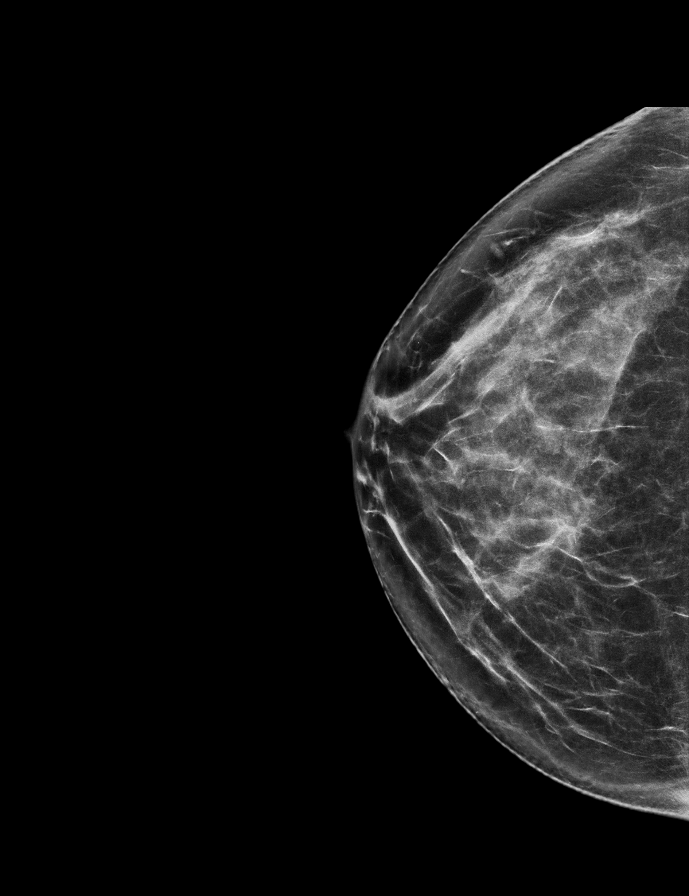

[R CC tomo · 2 of 68 frames shown]
[frame 22/68]
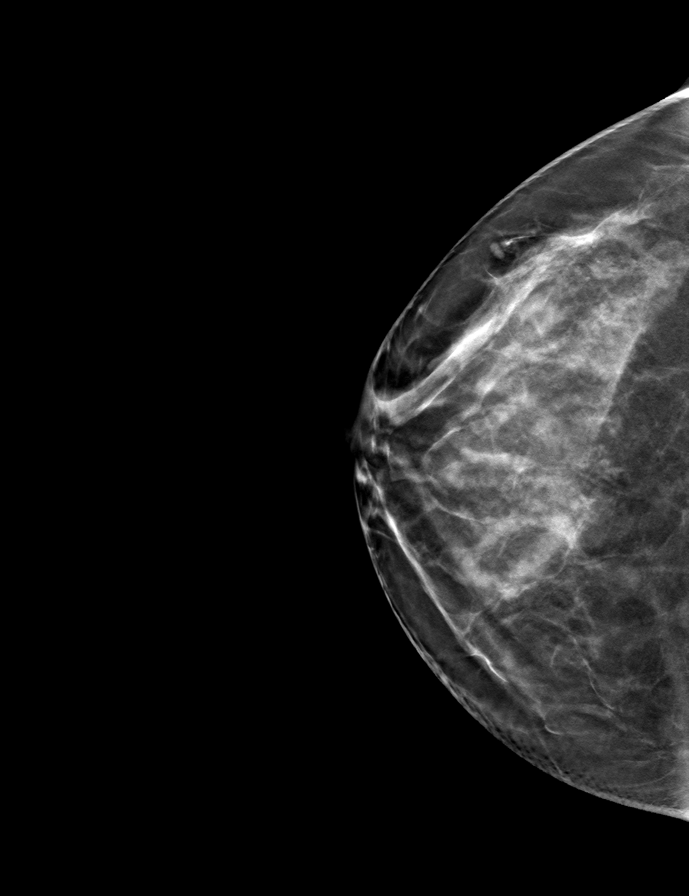
[frame 35/68]
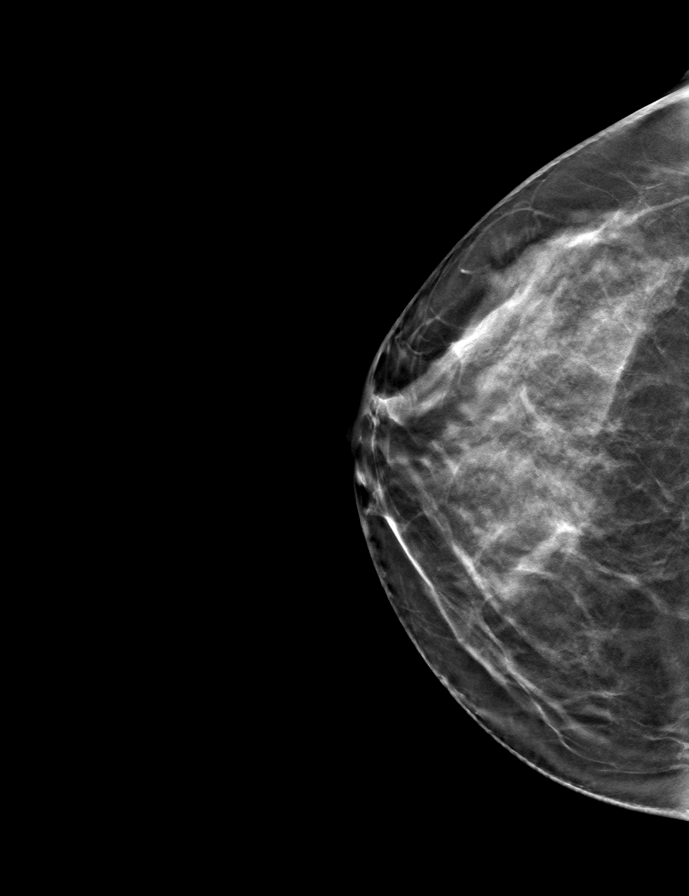

[R MLO tomo · tomo slice 33/66.0]
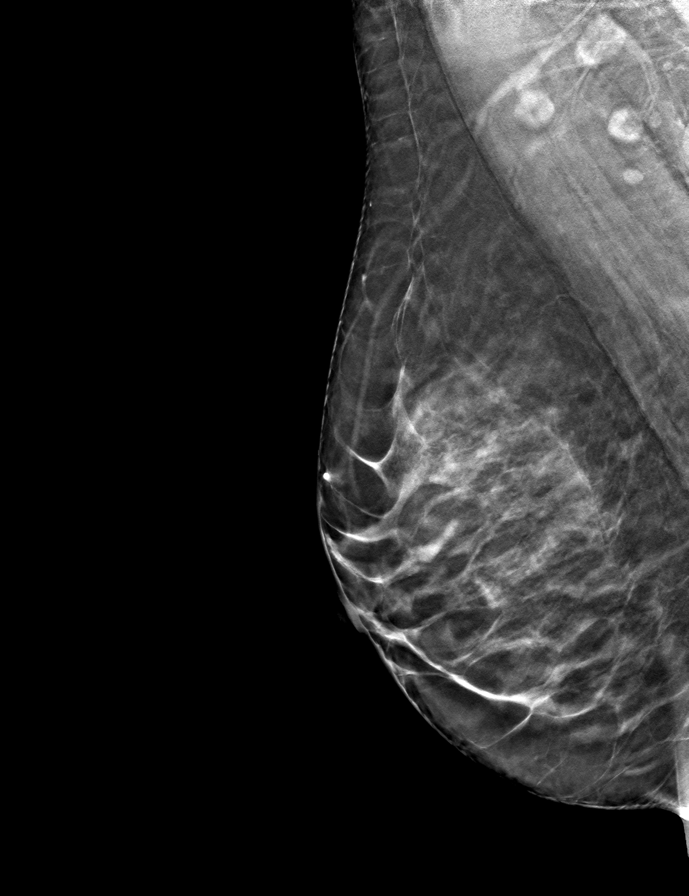

[L CC tomo · tomo slice 35/68.0]
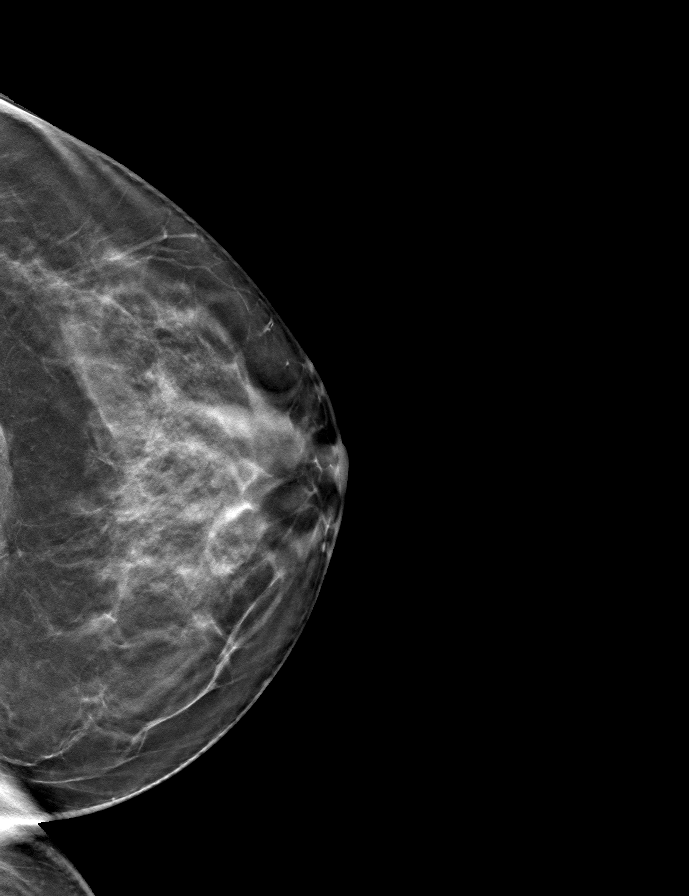

[L MLO tomo · tomo slice 35/70.0]
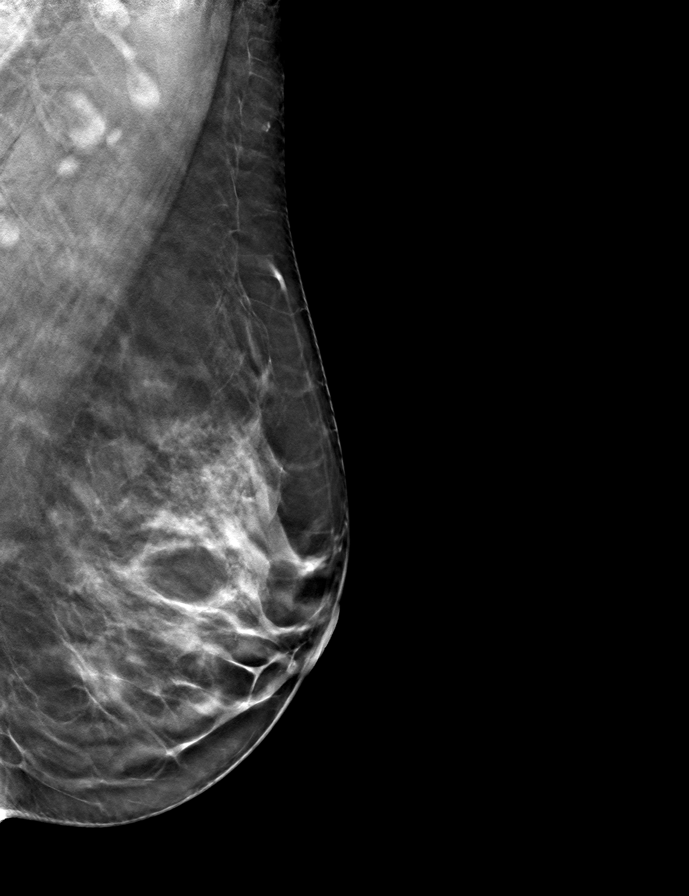

[9 of 24 positions shown; findings below may reference images not displayed]

ACR Breast Density Category c: The breast tissue is heterogeneously
dense, which may obscure small masses.
FINDINGS: There are no findings suspicious for malignancy.
IMPRESSION: No mammographic evidence of malignancy. A result letter of this
screening mammogram will be mailed directly to the patient.

RECOMMENDATION:
Screening mammogram in one year. (Code:Q3-W-BC3)

BI-RADS CATEGORY  1: Negative.

## 2023-05-30 ENCOUNTER — Ambulatory Visit (HOSPITAL_BASED_OUTPATIENT_CLINIC_OR_DEPARTMENT_OTHER): Payer: 59 | Admitting: Orthopaedic Surgery

## 2023-06-21 ENCOUNTER — Ambulatory Visit (HOSPITAL_BASED_OUTPATIENT_CLINIC_OR_DEPARTMENT_OTHER): Payer: 59 | Admitting: Orthopaedic Surgery

## 2023-06-21 ENCOUNTER — Ambulatory Visit (HOSPITAL_BASED_OUTPATIENT_CLINIC_OR_DEPARTMENT_OTHER): Payer: 59

## 2023-06-21 DIAGNOSIS — M25562 Pain in left knee: Secondary | ICD-10-CM

## 2023-06-21 DIAGNOSIS — G8929 Other chronic pain: Secondary | ICD-10-CM

## 2023-06-21 DIAGNOSIS — M1712 Unilateral primary osteoarthritis, left knee: Secondary | ICD-10-CM | POA: Diagnosis not present

## 2023-06-21 DIAGNOSIS — M545 Low back pain, unspecified: Secondary | ICD-10-CM | POA: Diagnosis not present

## 2023-06-21 MED ORDER — LIDOCAINE HCL 1 % IJ SOLN
4.0000 mL | INTRAMUSCULAR | Status: AC | PRN
  Administered 2023-06-21: 4 mL

## 2023-06-21 MED ORDER — TRIAMCINOLONE ACETONIDE 40 MG/ML IJ SUSP
80.0000 mg | INTRAMUSCULAR | Status: AC | PRN
  Administered 2023-06-21: 80 mg via INTRA_ARTICULAR
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
## 2023-06-30 ENCOUNTER — Other Ambulatory Visit: Payer: Self-pay | Admitting: Radiology

## 2023-06-30 DIAGNOSIS — N951 Menopausal and female climacteric states: Secondary | ICD-10-CM

## 2023-07-01 NOTE — Telephone Encounter (Signed)
 Medication refill request: progesterone  Last AEX:  03/22/23 Next AEX: not scheduled  Last MMG (if hormonal medication request): 10/06/22 Refill authorized: please advise

## 2023-07-09 ENCOUNTER — Ambulatory Visit: Payer: 59 | Admitting: Sports Medicine

## 2023-07-11 ENCOUNTER — Ambulatory Visit
Admission: RE | Admit: 2023-07-11 | Discharge: 2023-07-11 | Disposition: A | Discharge status: Home / Self Care | Source: Ambulatory Visit | Attending: Orthopaedic Surgery | Admitting: Orthopaedic Surgery

## 2023-07-11 DIAGNOSIS — G8929 Other chronic pain: Secondary | ICD-10-CM

## 2023-07-19 ENCOUNTER — Ambulatory Visit (HOSPITAL_BASED_OUTPATIENT_CLINIC_OR_DEPARTMENT_OTHER): Payer: 59 | Admitting: Orthopaedic Surgery

## 2023-08-21 ENCOUNTER — Ambulatory Visit (HOSPITAL_BASED_OUTPATIENT_CLINIC_OR_DEPARTMENT_OTHER): Admitting: Orthopaedic Surgery

## 2023-08-28 ENCOUNTER — Ambulatory Visit (HOSPITAL_BASED_OUTPATIENT_CLINIC_OR_DEPARTMENT_OTHER): Admitting: Orthopaedic Surgery

## 2023-08-28 DIAGNOSIS — M1712 Unilateral primary osteoarthritis, left knee: Secondary | ICD-10-CM
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
## 2023-09-17 ENCOUNTER — Other Ambulatory Visit: Payer: Self-pay | Admitting: Orthopaedic Surgery

## 2023-09-17 ENCOUNTER — Telehealth (HOSPITAL_BASED_OUTPATIENT_CLINIC_OR_DEPARTMENT_OTHER): Payer: Self-pay | Admitting: Orthopaedic Surgery

## 2023-09-17 MED ORDER — TRAMADOL HCL 50 MG PO TABS: 50.0000 mg | ORAL_TABLET | Freq: Four times a day (QID) | ORAL | 0 refills | Status: AC | PRN

## 2023-09-17 NOTE — Telephone Encounter (Signed)
 Patient called back. She doesn't do well on narcotics. She stated she is having LBP that started after yoga on Thursday. She wanted to know if you would be willing to send some prednisone to the [facility] -[address], [place], [place] [address] since she isnt nearby to be seen. Please advise

## 2023-09-17 NOTE — Telephone Encounter (Signed)
 Patient wants to know if you can call her in something for her pain she is in [facility] or if there is anywhere you can send a referral  for her to get an injection at [facility]. Contact number [phone]

## 2023-09-17 NOTE — Telephone Encounter (Signed)
Lvm for pt to cb to discuss  

## 2023-09-18 ENCOUNTER — Other Ambulatory Visit (HOSPITAL_BASED_OUTPATIENT_CLINIC_OR_DEPARTMENT_OTHER): Payer: Self-pay | Admitting: Orthopaedic Surgery

## 2023-09-18 MED ORDER — METHYLPREDNISOLONE 4 MG PO TBPK: ORAL_TABLET | ORAL | 0 refills | Status: AC

## 2023-09-25 NOTE — Telephone Encounter (Signed)
 Sandra Cain, I talked to the pt and I think she possibly needs a SI joint inj. Dr.Brooks had a 8:15 opening. I added her to hold the spot but told her I wanted to make sure it was ok with you guys. Please advise She wants to get in asap

## 2023-09-25 NOTE — Telephone Encounter (Signed)
LVM for pt to cb to discuss

## 2023-09-26 ENCOUNTER — Other Ambulatory Visit: Payer: Self-pay

## 2023-09-26 ENCOUNTER — Ambulatory Visit: Admitting: Sports Medicine

## 2023-09-26 ENCOUNTER — Encounter: Payer: Self-pay | Admitting: Sports Medicine

## 2023-09-26 ENCOUNTER — Ambulatory Visit (HOSPITAL_BASED_OUTPATIENT_CLINIC_OR_DEPARTMENT_OTHER): Admitting: Student

## 2023-09-26 DIAGNOSIS — M533 Sacrococcygeal disorders, not elsewhere classified: Secondary | ICD-10-CM | POA: Diagnosis not present

## 2023-09-26 DIAGNOSIS — M069 Rheumatoid arthritis, unspecified: Secondary | ICD-10-CM | POA: Diagnosis not present

## 2023-09-26 DIAGNOSIS — M4156 Other secondary scoliosis, lumbar region: Secondary | ICD-10-CM

## 2023-09-26 DIAGNOSIS — M7918 Myalgia, other site: Secondary | ICD-10-CM | POA: Diagnosis not present

## 2023-09-26 DIAGNOSIS — G8929 Other chronic pain: Secondary | ICD-10-CM | POA: Diagnosis not present

## 2023-09-26 NOTE — Progress Notes (Signed)
 Sandra Cain - 53 y.o. female MRN [id number]  Date of birth: 12/22/1970  Office Visit Note: Visit Date: 09/26/2023 PCP: Patient, No Pcp Per Referred by: No ref. provider found  Subjective: Chief Complaint  Patient presents with   Lower Back - Pain   HPI: Sandra Cain is a pleasant 53 y.o. female who presents today for evaluation of chronic low back pain and Left-SI joint dysfunction.  She has had left-sided low back pain around the SI joint region for a number of months now.  She has been treated at the chiropractor who stated her hips were off and had made adjustments but no long-term benefit.  She tried oral prednisone without much relief.  Ibuprofen as needed does help.  She is also found her low back and SI joint pain worsens with sleeping on her mattress, but did get better when sleeping on the couch.  She also feels some knots/superficial nodules when palpating her on the left side of the low back. She denies any numbness or tingling radiating down the leg.  She has a pertinent past medical history of rheumatoid arthritis as well.  Pertinent ROS were reviewed with the patient and found to be negative unless otherwise specified above in HPI.   Assessment & Plan: Visit Diagnoses:  1. Chronic left SI joint pain   2. Other secondary scoliosis, lumbar region   3. Myofascial pain on left side   4. Rheumatoid arthritis involving multiple sites, unspecified whether rheumatoid factor present (HCC)    Plan: Impression is chronic left-sided low back and SI joint pain in the setting of a mild lumbar dextroscoliosis. She does have a myofascial restriction pattern of the low back and posterior pelvis which I do believe is contributing.  She also has concomitant rheumatoid arthritis, we did discuss that sometimes this can localized to the SI joint and the pathology of sacroiliitis, although I do not see any significant arthritic change in the SI joint on her previous x-ray.  For both diagnostic and  therapeutic purposes, we did proceed with ultrasound-guided left SI joint injection.  Advised on postinjection protocol. Given her myofascial restriction and scoliosis, I did discussed dry needling and other soft tissue treatments for this region and provided information for Sandra Cain) for the patient to undergo a few treatments.  I would like to see Sandra Cain back in about the next 6 weeks for reevaluation.  Could consider formal or home formalized physical therapy for the SI joint region as well but would like to see response to her injection and ST-treatment with Sandra Cain.  She will continue with ibuprofen as needed, she also has a few tablets of tramadol prescribed for Sandra Cain to take for breakthrough pain. F/u in 6 weeks.  Follow-up: Return in about 6 weeks (around 11/07/2023) for Left SI joint.   Meds & Orders: No orders of the defined types were placed in this encounter.   Orders Placed This Encounter  Procedures   US  Guided Needle Placement - No Linked Charges     Procedures: U/S-guided SI-joint injection, Left   After discussion of risk/benefits/indications, informed verbal consent was obtained. A timeout was then performed. The patient was positioned in a prone position on exam room table with a pillow placed under the pelvis for mild hip flexion. The SI joint area was cleaned and prepped with betadine and alcohol swabs. Sterile ultrasound gel was applied and the ultrasound transducer was placed in an anatomic axial plane over the PSIS, then moved  distally over the SI-joint. Using ultrasound guidance, a 22-gauge, 3.5" needle was inserted from a medial to lateral approach utilizing an in-plane approach and directed into the SI-joint. The SI-joint was then injected with a mixture of 4:2 lidocaine :depomedrol with visualization of the injectate flow into the SI-joint under ultrasound visualization. The patient tolerated the procedure well without immediate complications.        Clinical History: No specialty comments available.  She reports that she has quit smoking. Her smoking use included cigarettes. She has never been exposed to tobacco smoke. She has never used smokeless tobacco. No results for input(s): "HGBA1C", "LABURIC" in the last 8760 hours.  Objective:    Physical Exam  Gen: Well-appearing, in no acute distress; non-toxic CV: Well-perfused. Warm.  Resp: Breathing unlabored on room air; no wheezing. Psych: Fluid speech in conversation; appropriate affect; normal thought process  Ortho Exam - Low back: + TTP overlying the left SI joint region just inferior to the PSIS. There is a degree of fascial nodularity in this location of the low back in the SI joint region.  No redness swelling or effusion.  Positive Fortin's point test.  There is mild dextroscoliosis noted of the lumbar spine.  As the left ilium sits higher compared to the right.  Leg lengths are nearly equivocal.  Imaging:  I did review lumbar X-rays and performed independent interpretation and review of the AP, lateral and flexion/extension views from 06/21/2023.  There is mild degenerative disc disease throughout the lumbar spine.  Very minimal grade 1 anterolisthesis of L5 on S1.  There is a mild dextroscoliosis of the lumbar spine only.  There is no significant arthritic change within the SI joints.  No acute fracture or otherwise acute bony abnormality noted.  Narrative & Impression  CLINICAL DATA:  Chronic low back pain.  Recent fall.   EXAM: LUMBAR SPINE - COMPLETE 4+ VIEW   COMPARISON:  None Available.   FINDINGS: Mild rightward curvature of the lumbar spine. Preservation of the vertebral body heights. Grade 1 anterolisthesis L5-S1. Mild multilevel degenerative disc disease. Lower lumbar spine facet degenerative changes. SI joints unremarkable. No acute process.   IMPRESSION: Mild multilevel degenerative disc and facet disease.     Electronically Signed   By: Sandra Cain  M.D.   On: 07/08/2023 14:10    Past Medical/Family/Surgical/Social History: Medications & Allergies reviewed per EMR, new medications updated. There are no active problems to display for this patient.  Past Medical History:  Diagnosis Date   Arthritis    HSV-1 infection    Rheumatoid arthritis (HCC)    Family History  Problem Relation Age of Onset   Osteoarthritis Mother    Osteoporosis Mother    Lung cancer Father    Osteoarthritis Sister    Alcoholism Brother    Drug abuse Brother    Past Surgical History:  Procedure Laterality Date   ABDOMINOPLASTY  2012   CESAREAN SECTION     x 3   Social History   Occupational History   Not on file  Tobacco Use   Smoking status: Former    Types: Cigarettes    Passive exposure: Never   Smokeless tobacco: Never   Tobacco comments:    Smoked in college  Vaping Use   Vaping status: Never Used  Substance and Sexual Activity   Alcohol use: Never   Drug use: Never   Sexual activity: Yes    Partners: Male    Comment: withdrawal, menarche 53yo, sexual debut 53yo

## 2023-09-30 ENCOUNTER — Telehealth: Payer: Self-pay | Admitting: Radiology

## 2023-09-30 NOTE — Telephone Encounter (Signed)
 Patient is status post left SI joint injection on 09/26/2023. She states that she is no better and continues to have lower back pain. She is aware that it may take some time for the injection to take effect, however, she was unsure if she should resume yoga since she is still hurting.  She would like to go back, but does not want to make anything worse.  Please advise. CB for patient is [phone]

## 2023-12-27 ENCOUNTER — Telehealth (HOSPITAL_BASED_OUTPATIENT_CLINIC_OR_DEPARTMENT_OTHER): Payer: Self-pay | Admitting: Orthopaedic Surgery

## 2023-12-27 NOTE — Telephone Encounter (Signed)
 Maybe leonce? If not will hold for [employer]

## 2023-12-27 NOTE — Telephone Encounter (Signed)
 Patient wants to get in next week for an injections with Dr B. He does not have availably please advise

## 2024-01-02 ENCOUNTER — Ambulatory Visit (HOSPITAL_BASED_OUTPATIENT_CLINIC_OR_DEPARTMENT_OTHER): Admitting: Student

## 2024-01-06 ENCOUNTER — Ambulatory Visit (HOSPITAL_BASED_OUTPATIENT_CLINIC_OR_DEPARTMENT_OTHER): Admitting: Student

## 2024-01-06 ENCOUNTER — Encounter (HOSPITAL_BASED_OUTPATIENT_CLINIC_OR_DEPARTMENT_OTHER): Payer: Self-pay

## 2024-01-15 ENCOUNTER — Other Ambulatory Visit: Payer: Self-pay | Admitting: Radiology

## 2024-01-15 DIAGNOSIS — Z1231 Encounter for screening mammogram for malignant neoplasm of breast: Secondary | ICD-10-CM

## 2024-01-29 ENCOUNTER — Ambulatory Visit
Admission: RE | Admit: 2024-01-29 | Discharge: 2024-01-29 | Disposition: A | Discharge status: Home / Self Care | Source: Ambulatory Visit | Attending: Radiology | Admitting: Radiology

## 2024-01-29 DIAGNOSIS — Z1231 Encounter for screening mammogram for malignant neoplasm of breast: Secondary | ICD-10-CM

## 2024-02-13 ENCOUNTER — Ambulatory Visit (INDEPENDENT_AMBULATORY_CARE_PROVIDER_SITE_OTHER): Admitting: Student

## 2024-02-13 DIAGNOSIS — M1712 Unilateral primary osteoarthritis, left knee: Secondary | ICD-10-CM

## 2024-02-13 DIAGNOSIS — M25562 Pain in left knee: Secondary | ICD-10-CM

## 2024-02-13 MED ORDER — TRIAMCINOLONE ACETONIDE 40 MG/ML IJ SUSP
2.0000 mL | INTRAMUSCULAR | Status: AC | PRN
  Administered 2024-02-13: 2 mL via INTRA_ARTICULAR

## 2024-02-13 MED ORDER — LIDOCAINE HCL 1 % IJ SOLN
4.0000 mL | INTRAMUSCULAR | Status: AC | PRN
  Administered 2024-02-13: 4 mL
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
## 2024-03-02 ENCOUNTER — Encounter: Payer: Self-pay | Admitting: Radiology

## 2024-04-17 ENCOUNTER — Ambulatory Visit: Admitting: Radiology

## 2024-04-17 ENCOUNTER — Encounter: Payer: Self-pay | Admitting: Radiology

## 2024-04-17 VITALS — BP 116/78 | HR 84 | Temp 98.3°F | Wt 149.0 lb

## 2024-04-17 DIAGNOSIS — Z113 Encounter for screening for infections with a predominantly sexual mode of transmission: Secondary | ICD-10-CM

## 2024-04-17 DIAGNOSIS — F419 Anxiety disorder, unspecified: Secondary | ICD-10-CM

## 2024-04-17 DIAGNOSIS — R350 Frequency of micturition: Secondary | ICD-10-CM | POA: Diagnosis not present

## 2024-04-17 DIAGNOSIS — L309 Dermatitis, unspecified: Secondary | ICD-10-CM | POA: Diagnosis not present

## 2024-04-17 DIAGNOSIS — N898 Other specified noninflammatory disorders of vagina: Secondary | ICD-10-CM

## 2024-04-17 MED ORDER — BUSPIRONE HCL 5 MG PO TABS: 5.0000 mg | ORAL_TABLET | Freq: Three times a day (TID) | ORAL | 1 refills | Status: AC

## 2024-04-17 MED ORDER — TRIAMCINOLONE ACETONIDE 0.5 % EX OINT: 1.0000 | TOPICAL_OINTMENT | Freq: Two times a day (BID) | CUTANEOUS | 0 refills | Status: AC

## 2024-04-17 NOTE — Addendum Note (Signed)
 Addended by: Truth Wolaver on: 04/17/2024 09:41 AM   Modules accepted: Orders

## 2024-04-17 NOTE — Progress Notes (Addendum)
" ° ° ° ° °  Subjective: Sandra Cain is a 53 y.o. female who complains of urinary urgency, frequency, no dysuria, vaginal discharge, no odor, no itching. Partner is out of town and he's having burning with urination, bloody penile discharge. Patient is concerned she may have a STI. Also c/o worsening anxiety, requesting something to help. Also needs a refill on steroid cream for eczema.   Review of Systems  All other systems reviewed and are negative.   Past Medical History:  Diagnosis Date   Arthritis    HSV-1 infection    Rheumatoid arthritis (HCC)       Objective:  Today's Vitals   04/17/24 0826  BP: 116/78  Pulse: 84  Temp: 98.3 F (36.8 C)  TempSrc: Oral  SpO2: 99%  Weight: 149 lb (67.6 kg)   Body mass index is 25.38 kg/m.   Physical Exam Vitals and nursing note reviewed. Exam conducted with a chaperone present.  Constitutional:      Appearance: Normal appearance. She is well-developed.  Pulmonary:     Effort: Pulmonary effort is normal.  Abdominal:     General: Abdomen is flat.     Palpations: Abdomen is soft.  Genitourinary:    General: Normal vulva.     Vagina: Vaginal discharge present. No erythema, bleeding or lesions.     Cervix: Normal. No discharge, friability, lesion or erythema.     Uterus: Normal.      Adnexa: Right adnexa normal and left adnexa normal.  Neurological:     Mental Status: She is alert.  Psychiatric:        Mood and Affect: Mood normal.        Thought Content: Thought content normal.        Judgment: Judgment normal.      Darice Hoit, CMA present for exam  Assessment:/Plan:  1. Urinary frequency (Primary) - Urinalysis,Complete w/RFL Culture  2. Screening for STDs (sexually transmitted diseases) - SureSwab Advanced Vaginitis Plus,TMA  3. Vaginal discharge - SureSwab Advanced Vaginitis Plus,TMA   4. Anxiety - busPIRone (BUSPAR) 5 MG tablet; Take 1 tablet (5 mg total) by mouth 3 (three) times daily.  Dispense: 90 tablet;  Refill: 1  5. Dermatitis - triamcinolone  ointment (KENALOG ) 0.5 %; Apply 1 Application topically 2 (two) times daily.  Dispense: 30 g; Refill: 0   Will contact patient with results of testing completed today. Avoid intercourse until symptoms are resolved. Safe sex encouraged. Avoid the use of soaps or perfumed products in the peri area. Avoid tub baths and sitting in sweaty or wet clothing for prolonged periods of time.    Jericca Russett B, NP 9:29 AM  "

## 2024-04-18 LAB — SURESWAB® ADVANCED VAGINITIS PLUS,TMA
C. trachomatis RNA, TMA: NOT DETECTED
CANDIDA SPECIES: NOT DETECTED
Candida glabrata: NOT DETECTED
N. gonorrhoeae RNA, TMA: NOT DETECTED
SURESWAB(R) ADV BACTERIAL VAGINOSIS(BV),TMA: NEGATIVE
TRICHOMONAS VAGINALIS (TV),TMA: NOT DETECTED

## 2024-04-19 LAB — URINALYSIS, COMPLETE W/RFL CULTURE
Casts: NONE SEEN /LPF
Crystals: NONE SEEN " /HPF"
Glucose, UA: NEGATIVE
Hgb urine dipstick: NEGATIVE
Leukocyte Esterase: NEGATIVE
Nitrites, Initial: NEGATIVE
RBC / HPF: NONE SEEN /HPF (ref 0–2)
Specific Gravity, Urine: 1.02 (ref 1.001–1.035)
WBC, UA: NONE SEEN /HPF (ref 0–5)
Yeast: NONE SEEN /HPF
pH: 6 (ref 5.0–8.0)

## 2024-04-19 LAB — URINE CULTURE
MICRO NUMBER:: 17378721
SPECIMEN QUALITY:: ADEQUATE

## 2024-04-19 LAB — CULTURE INDICATED

## 2024-04-20 ENCOUNTER — Ambulatory Visit: Payer: Self-pay | Admitting: Radiology

## 2024-04-20 ENCOUNTER — Ambulatory Visit: Admitting: Radiology

## 2024-04-28 ENCOUNTER — Encounter: Payer: Self-pay | Admitting: Radiology

## 2024-05-07 ENCOUNTER — Ambulatory Visit: Admitting: Radiology

## 2024-06-11 ENCOUNTER — Ambulatory Visit: Admitting: Radiology
